# Patient Record
Sex: Female | Born: 1995 | Race: White | Hispanic: No | Marital: Single | Smoking: Never smoker
Health system: Southern US, Community
[De-identification: ages and names within clinical notes are randomized; demographics above are authoritative.]

## PROBLEM LIST (undated history)

## (undated) HISTORY — PX: WISDOM TOOTH EXTRACTION: SHX21

---
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
## 2019-02-11 ENCOUNTER — Ambulatory Visit: Payer: BC Managed Care – PPO | Attending: Internal Medicine

## 2019-02-11 DIAGNOSIS — Z20822 Contact with and (suspected) exposure to covid-19: Secondary | ICD-10-CM

## 2019-02-12 LAB — NOVEL CORONAVIRUS, NAA: SARS-CoV-2, NAA: NOT DETECTED

## 2024-01-14 ENCOUNTER — Other Ambulatory Visit: Payer: Self-pay | Admitting: Medical Genetics

## 2024-01-21 ENCOUNTER — Other Ambulatory Visit (HOSPITAL_COMMUNITY)
Admission: RE | Admit: 2024-01-21 | Discharge: 2024-01-21 | Disposition: A | Payer: Self-pay | Source: Ambulatory Visit | Attending: Medical Genetics | Admitting: Medical Genetics

## 2024-02-01 LAB — GENECONNECT MOLECULAR SCREEN: Genetic Analysis Overall Interpretation: NEGATIVE
